# Patient Record
Sex: Female | Born: 1980 | Race: Black or African American | Hispanic: No | Marital: Single | Smoking: Current every day smoker
Health system: Southern US, Community
[De-identification: ages and names within clinical notes are randomized; demographics above are authoritative.]

## PROBLEM LIST (undated history)

## (undated) HISTORY — PX: BREAST LUMPECTOMY: SHX2

---

## 1998-12-24 ENCOUNTER — Ambulatory Visit (HOSPITAL_COMMUNITY): Admission: RE | Admit: 1998-12-24 | Discharge: 1998-12-24 | Payer: Self-pay | Admitting: *Deleted

## 1999-03-24 ENCOUNTER — Inpatient Hospital Stay (HOSPITAL_COMMUNITY): Admission: AD | Admit: 1999-03-24 | Discharge: 1999-03-27 | Payer: Self-pay | Admitting: Obstetrics

## 1999-04-07 ENCOUNTER — Observation Stay (HOSPITAL_COMMUNITY): Admission: AD | Admit: 1999-04-07 | Discharge: 1999-04-08 | Payer: Self-pay | Admitting: *Deleted

## 1999-05-03 ENCOUNTER — Inpatient Hospital Stay (HOSPITAL_COMMUNITY): Admission: AD | Admit: 1999-05-03 | Discharge: 1999-05-03 | Payer: Self-pay | Admitting: Obstetrics & Gynecology

## 1999-05-08 ENCOUNTER — Inpatient Hospital Stay (HOSPITAL_COMMUNITY): Admission: AD | Admit: 1999-05-08 | Discharge: 1999-05-08 | Payer: Self-pay | Admitting: *Deleted

## 1999-05-10 ENCOUNTER — Inpatient Hospital Stay (HOSPITAL_COMMUNITY): Admission: AD | Admit: 1999-05-10 | Discharge: 1999-05-10 | Payer: Self-pay | Admitting: Obstetrics

## 1999-05-29 ENCOUNTER — Inpatient Hospital Stay (HOSPITAL_COMMUNITY): Admission: AD | Admit: 1999-05-29 | Discharge: 1999-05-31 | Payer: Self-pay

## 1999-09-23 ENCOUNTER — Ambulatory Visit (HOSPITAL_BASED_OUTPATIENT_CLINIC_OR_DEPARTMENT_OTHER): Admission: RE | Admit: 1999-09-23 | Discharge: 1999-09-23 | Payer: Self-pay | Admitting: General Surgery

## 1999-09-23 ENCOUNTER — Encounter (INDEPENDENT_AMBULATORY_CARE_PROVIDER_SITE_OTHER): Payer: Self-pay | Admitting: *Deleted

## 2001-01-05 ENCOUNTER — Emergency Department (HOSPITAL_COMMUNITY): Admission: EM | Admit: 2001-01-05 | Discharge: 2001-01-05 | Payer: Self-pay | Admitting: Emergency Medicine

## 2002-01-17 ENCOUNTER — Encounter (HOSPITAL_BASED_OUTPATIENT_CLINIC_OR_DEPARTMENT_OTHER): Payer: Self-pay | Admitting: General Surgery

## 2002-01-19 ENCOUNTER — Encounter (INDEPENDENT_AMBULATORY_CARE_PROVIDER_SITE_OTHER): Payer: Self-pay | Admitting: *Deleted

## 2002-01-19 ENCOUNTER — Ambulatory Visit (HOSPITAL_COMMUNITY): Admission: RE | Admit: 2002-01-19 | Discharge: 2002-01-19 | Payer: Self-pay | Admitting: General Surgery

## 2002-03-04 ENCOUNTER — Emergency Department (HOSPITAL_COMMUNITY): Admission: EM | Admit: 2002-03-04 | Discharge: 2002-03-05 | Payer: Self-pay | Admitting: *Deleted

## 2003-02-22 ENCOUNTER — Encounter (HOSPITAL_BASED_OUTPATIENT_CLINIC_OR_DEPARTMENT_OTHER): Payer: Self-pay | Admitting: General Surgery

## 2003-02-26 ENCOUNTER — Encounter (INDEPENDENT_AMBULATORY_CARE_PROVIDER_SITE_OTHER): Payer: Self-pay | Admitting: *Deleted

## 2003-02-26 ENCOUNTER — Ambulatory Visit (HOSPITAL_COMMUNITY): Admission: RE | Admit: 2003-02-26 | Discharge: 2003-02-26 | Payer: Self-pay | Admitting: General Surgery

## 2003-04-08 ENCOUNTER — Emergency Department (HOSPITAL_COMMUNITY): Admission: EM | Admit: 2003-04-08 | Discharge: 2003-04-08 | Payer: Self-pay | Admitting: Emergency Medicine

## 2003-10-01 ENCOUNTER — Emergency Department (HOSPITAL_COMMUNITY): Admission: AD | Admit: 2003-10-01 | Discharge: 2003-10-01 | Payer: Self-pay | Admitting: Family Medicine

## 2003-11-22 ENCOUNTER — Other Ambulatory Visit: Admission: RE | Admit: 2003-11-22 | Discharge: 2003-11-22 | Payer: Self-pay | Admitting: Obstetrics and Gynecology

## 2004-01-29 ENCOUNTER — Inpatient Hospital Stay (HOSPITAL_COMMUNITY): Admission: AD | Admit: 2004-01-29 | Discharge: 2004-01-29 | Payer: Self-pay | Admitting: Obstetrics and Gynecology

## 2004-03-04 ENCOUNTER — Inpatient Hospital Stay (HOSPITAL_COMMUNITY): Admission: AD | Admit: 2004-03-04 | Discharge: 2004-03-04 | Payer: Self-pay | Admitting: Obstetrics and Gynecology

## 2004-03-05 ENCOUNTER — Inpatient Hospital Stay (HOSPITAL_COMMUNITY): Admission: AD | Admit: 2004-03-05 | Discharge: 2004-03-05 | Payer: Self-pay | Admitting: Obstetrics and Gynecology

## 2004-03-28 ENCOUNTER — Inpatient Hospital Stay (HOSPITAL_COMMUNITY): Admission: AD | Admit: 2004-03-28 | Discharge: 2004-03-28 | Payer: Self-pay | Admitting: Obstetrics and Gynecology

## 2004-04-24 ENCOUNTER — Inpatient Hospital Stay (HOSPITAL_COMMUNITY): Admission: AD | Admit: 2004-04-24 | Discharge: 2004-04-24 | Payer: Self-pay | Admitting: Obstetrics and Gynecology

## 2004-05-20 ENCOUNTER — Inpatient Hospital Stay (HOSPITAL_COMMUNITY): Admission: AD | Admit: 2004-05-20 | Discharge: 2004-05-22 | Payer: Self-pay | Admitting: Obstetrics and Gynecology

## 2006-10-07 ENCOUNTER — Emergency Department (HOSPITAL_COMMUNITY): Admission: EM | Admit: 2006-10-07 | Discharge: 2006-10-07 | Payer: Self-pay | Admitting: Emergency Medicine

## 2007-03-05 ENCOUNTER — Inpatient Hospital Stay (HOSPITAL_COMMUNITY): Admission: AD | Admit: 2007-03-05 | Discharge: 2007-03-05 | Payer: Self-pay | Admitting: Obstetrics and Gynecology

## 2007-03-27 ENCOUNTER — Inpatient Hospital Stay (HOSPITAL_COMMUNITY): Admission: AD | Admit: 2007-03-27 | Discharge: 2007-03-28 | Payer: Self-pay | Admitting: Obstetrics and Gynecology

## 2007-04-04 ENCOUNTER — Inpatient Hospital Stay (HOSPITAL_COMMUNITY): Admission: AD | Admit: 2007-04-04 | Discharge: 2007-04-07 | Payer: Self-pay | Admitting: Obstetrics and Gynecology

## 2007-04-05 ENCOUNTER — Encounter (INDEPENDENT_AMBULATORY_CARE_PROVIDER_SITE_OTHER): Payer: Self-pay | Admitting: Obstetrics and Gynecology

## 2008-03-24 ENCOUNTER — Emergency Department (HOSPITAL_COMMUNITY): Admission: EM | Admit: 2008-03-24 | Discharge: 2008-03-24 | Payer: Self-pay | Admitting: Emergency Medicine

## 2008-03-30 ENCOUNTER — Emergency Department (HOSPITAL_COMMUNITY): Admission: EM | Admit: 2008-03-30 | Discharge: 2008-03-30 | Payer: Self-pay | Admitting: Emergency Medicine

## 2008-07-16 ENCOUNTER — Emergency Department (HOSPITAL_COMMUNITY): Admission: EM | Admit: 2008-07-16 | Discharge: 2008-07-16 | Payer: Self-pay | Admitting: Emergency Medicine

## 2009-07-23 ENCOUNTER — Emergency Department (HOSPITAL_COMMUNITY): Admission: EM | Admit: 2009-07-23 | Discharge: 2009-07-23 | Payer: Self-pay | Admitting: Emergency Medicine

## 2010-09-19 ENCOUNTER — Emergency Department (HOSPITAL_COMMUNITY)
Admission: EM | Admit: 2010-09-19 | Discharge: 2010-09-19 | Payer: Self-pay | Source: Home / Self Care | Admitting: Emergency Medicine

## 2010-09-26 ENCOUNTER — Emergency Department (HOSPITAL_COMMUNITY)
Admission: EM | Admit: 2010-09-26 | Discharge: 2010-09-26 | Payer: Self-pay | Source: Home / Self Care | Admitting: Family Medicine

## 2010-12-22 LAB — URINALYSIS, ROUTINE W REFLEX MICROSCOPIC
Ketones, ur: NEGATIVE mg/dL
Leukocytes, UA: NEGATIVE
Nitrite: NEGATIVE
Specific Gravity, Urine: 1.016 (ref 1.005–1.030)
Urobilinogen, UA: 0.2 mg/dL (ref 0.0–1.0)
pH: 6 (ref 5.0–8.0)

## 2010-12-22 LAB — URINE MICROSCOPIC-ADD ON

## 2010-12-22 LAB — POCT PREGNANCY, URINE: Preg Test, Ur: NEGATIVE

## 2010-12-22 LAB — WET PREP, GENITAL
Clue Cells Wet Prep HPF POC: NONE SEEN
Yeast Wet Prep HPF POC: NONE SEEN

## 2011-01-14 LAB — BASIC METABOLIC PANEL
CO2: 27 mEq/L (ref 19–32)
Calcium: 8.6 mg/dL (ref 8.4–10.5)
Chloride: 107 mEq/L (ref 96–112)
GFR calc Af Amer: >60 mL/min (ref >60)
Glucose, Bld: 82 mg/dL (ref 70–99)
Potassium: 4 mEq/L (ref 3.5–5.1)
Sodium: 138 mEq/L (ref 135–145)

## 2011-01-14 LAB — CBC
HCT: 35.4 % — ABNORMAL LOW (ref 36.0–46.0)
Hemoglobin: 12 g/dL (ref 12.0–15.0)
MCHC: 34 g/dL (ref 30.0–36.0)
RBC: 4.12 MIL/uL (ref 3.87–5.11)
RDW: 13.4 % (ref 11.5–15.5)

## 2011-01-14 LAB — URINE CULTURE: Colony Count: >=100000

## 2011-01-14 LAB — GC/CHLAMYDIA PROBE AMP, GENITAL: GC Probe Amp, Genital: NEGATIVE

## 2011-01-14 LAB — URINALYSIS, ROUTINE W REFLEX MICROSCOPIC
Bilirubin Urine: NEGATIVE
Hgb urine dipstick: NEGATIVE
Ketones, ur: NEGATIVE mg/dL
Nitrite: POSITIVE — AB
Protein, ur: NEGATIVE mg/dL
Specific Gravity, Urine: 1.023 (ref 1.005–1.030)
Urobilinogen, UA: 0.2 mg/dL (ref 0.0–1.0)

## 2011-01-14 LAB — DIFFERENTIAL
Basophils Absolute: 0 10*3/uL (ref 0.0–0.1)
Basophils Relative: 1 % (ref 0–1)
Eosinophils Relative: 2 % (ref 0–5)
Monocytes Absolute: 0.5 10*3/uL (ref 0.1–1.0)
Monocytes Relative: 9 % (ref 3–12)
Neutro Abs: 4.2 10*3/uL (ref 1.7–7.7)

## 2011-01-14 LAB — URINE MICROSCOPIC-ADD ON

## 2011-02-23 NOTE — H&P (Signed)
NAMEKARMA, ANSLEY NO.:  [id number]   MEDICAL RECORD NO.:  [id number]          PATIENT TYPE:  INP   LOCATION:  9112                          FACILITY:  [facility]   PHYSICIAN:  Malachi Pro. Ambrose Mantle, M.D. DATE OF BIRTH:  1981-01-18   DATE OF ADMISSION:  04/04/2007  DATE OF DISCHARGE:                              HISTORY & PHYSICAL   A 30 year old, black, single female, para 2-0-1-2, gravida 4, [facility] May 01, 2007, admitted with persistent contractions and progressive cervical  dilatation to 5 to 6 cm.  Blood group and type O positive, negative  antibody, sickle cell negative, RPR nonreactive, rubella immune,  hepatitis B surface antigen negative, HIV negative, GC and chlamydia  negative.  One-hour Glucola 92.  Group B strep status unknown.  [facility]  was called; the culture was in progress.  Quad screen was negative.  The  patient had an ultrasound on November 30, 2006; average gestational age  was 18 weeks 0 days, [facility] May 01, 2007.  Ultrasound on December 26, 2006:  [facility] May 03, 2007.  Prenatal care was uncomplicated except for  complaints of contractions.  Fetal fibronectin was negative on March 16, 2007.  On April 03, 2007, the cervix was 4 cm dilated.  However, on April 04, 2007, I examined the patient and felt she was 2-3 cm dilated and the  cervix was somewhat posterior.  She came to maternity admissions  contracting every 7 minutes and was initially thought by the MAU nurse  to be 6 cm.  However, subsequent exams by the charge nurse showed the  cervix to initially be 3-4 cm and progressed up to 4-5 cm.  She was  admitted and reached 5-6 cm in spite of fact her contractions were only  every 4-10 minutes.   PAST MEDICAL HISTORY:  SHOWED ALLERGY TO SHELLFISH.  Illnesses:  Anxiety.  Operations:  1993 appendectomy 1998, 2000 and 2004 breast  lumpectomies.   Alcohol, tobacco and drugs none.   FAMILY HISTORY:  Father with high blood pressure.   PHYSICAL EXAMINATION:  On  admission revealed normal vital signs.  HEART/LUNGS:  Were normal.  The abdomen was soft.  The fundal height had  been 36 cm on April 03, 2007.  Fetal heart tones showed no decelerations.  At 3:20 a.m. on April 05, 2007, the cervix was 5-6 cm, 80%, vertex at a -  3.  Artificial rupture of the membranes produced clear fluid.   IMPRESSION:  Intrauterine pregnancy at 36 weeks and 2 days.  The patient  was placed on IV penicillin for her unknown group B strep status and  less than 37 weeks' gestation.  She was placed on Pitocin.  She also  requested and received an epidural.  She progressed to full dilatation  and delivered spontaneously OA over an intact perineum by Dr. Ambrose Mantle a  living female infant, 5 pounds 13 ounces, with Apgars of 8 at one and 9  at five minutes.  Placenta was intact.  The uterus was normal.  There  were no lacerations.  Blood loss about 400  mL.  The patient requests  tubal  sterilization.  She has signed her sterilization forms over 30 days.  She has been counseled about the disadvantages proceeding to tubal  ligation.  She insists on proceeding with the procedure.  It is  scheduled for 2:30 p.m. on April 05, 2007.      Malachi Pro. Ambrose Mantle, M.D.  Electronically Signed     TFH/MEDQ  D:  04/05/2007  T:  04/05/2007  Job:  [id number]

## 2011-02-23 NOTE — Discharge Summary (Signed)
Raven Liu, BENEGAS NO.:  [id number]   MEDICAL RECORD NO.:  [id number]          PATIENT TYPE:  INP   LOCATION:  9112                          [facility]  [facility]   PHYSICIAN:  Malachi Pro. Ambrose Mantle, M.D. DATE OF BIRTH:  Jun 15, 1981   DATE OF ADMISSION:  04/04/2007  DATE OF DISCHARGE:  04/07/2007                               DISCHARGE SUMMARY   A 30 year old, black, single female, para 2-0-1-2, gravida 4, [facility] May 01, 2007 admitted with persistent contractions and progressive cervical  dilatation to 5-6 cm, O+, negative antibody, sickle cell negative, RPR  nonreactive, rubella immune, hepatitis B surface antigen negative, HIV  negative, GC and chlamydia negative.  One-hour Glucola 92.  Group B  strep status was unknown.  Quad screen was negative.  The patient's  history and physical are dictated in the history and physical, and in  essence, she came into the hospital with persistent contractions, made  cervical change to 5-6 cm.  Because of her unknown group B strep status  and less than [redacted] weeks gestation, she was placed on penicillin and  Pitocin, progressed to full dilatation and pushed out a living female  infant, 5 pounds 13 ounces, with Apgars of 8 at one and 9 at five  minutes.  The patient requested tubal ligation.  I emphasized to her the  uncertainty of the baby's health situation been premature, that I would  prefer that she wait for the tubal, but she insisted, so we went ahead  and did a tubal ligation in the afternoon of April 05, 2007.  She was  done well postoperatively, and on the second postpartum day, she is  ready for discharge.  RPR was nonreactive.  Initial hemoglobin 10.6,  hematocrit 32.2, white count 9900, platelet count 274,000.  Follow-up  hemoglobin was 9.4.  Urinalysis was negative.   FINAL DIAGNOSIS:  Intrauterine pregnancy at 36+ weeks, delivered vertex.  Voluntary sterilization.   OPERATION:  Spontaneous delivery, vertex.  There were no  lacerations.  Bilateral tubal ligation.   FINAL CONDITION:  Improved.   INSTRUCTIONS:  Include our regular discharge instruction booklet.  Percocet 5/325 #30 tablets 1 every 4-6 hours as needed for pain and no  refills, and the patient is asked to return to the office in 10 days for  follow-up examination.      Malachi Pro. Ambrose Mantle, M.D.  Electronically Signed     TFH/MEDQ  D:  04/07/2007  T:  04/07/2007  Job:  [id number]

## 2011-02-23 NOTE — Op Note (Signed)
Raven Liu, Raven Liu NO.:  [id number]   MEDICAL RECORD NO.:  [id number]          PATIENT TYPE:  INP   LOCATION:  9112                          [facility]   PHYSICIAN:  Malachi Pro. Ambrose Mantle, M.D. DATE OF BIRTH:  May 29, 1981   DATE OF PROCEDURE:  04/05/2007  DATE OF DISCHARGE:                               OPERATIVE REPORT   PREOPERATIVE DIAGNOSIS:  Voluntary sterilization.   POSTOPERATIVE DIAGNOSIS:  Voluntary sterilization.   OPERATION:  Bilateral tubal ligation.   OPERATOR:  Dr. Ambrose Mantle   ANESTHESIA:  Epidural anesthesia.   DESCRIPTION OF PROCEDURE:  The patient was brought to the operating  room, the epidural anesthetic was boosted, took approximately 20 minutes  for it to become effective. After anesthesia was confirmed a semilunar  incision was made in the inferior portion of the umbilicus. I made it in  the inferior portion of the umbilicus even though the patient's linea  nigra actually went to the left of the midline.  I carried in layers  down to the fascia and the fascia was actually opened with a hemostat.  The peritoneal cavity was entered with the help of one 4 x 18 sponge.  The right tube was identified and traced to its fimbriated end.  I did  not see the right ovary.  I made an incision in the mesosalpinx on the  right that two ties proximally and distally on the tube and then cut out  the intervening segment.  There was no bleeding. The exact same  procedure was performed on the left side. What could be seen of the  uterus appeared normal. The left ovary also could not be seen.  Both  tubal fimbria looked completely normal.  I then closed the peritoneum  with a pursestring suture of 0 Vicryl.  The fascia with 3-4 interrupted  figure-of-eight sutures of 0 Vicryl, subcu with 3-0 Vicryl and the skin  was reapproximated with 3-0 plain catgut.  The patient seemed to  tolerate the procedure well.  Blood loss was less than 10 mL.  Sponge  and needle  counts were correct and she was returned to recovery in  satisfactory condition.      Malachi Pro. Ambrose Mantle, M.D.  Electronically Signed     TFH/MEDQ  D:  04/05/2007  T:  04/05/2007  Job:  [id number]

## 2011-02-26 NOTE — Discharge Summary (Signed)
NAME:  DARCEL, ZICK                         ACCOUNT NO.:  [id number]   MEDICAL RECORD NO.:  [id number]                   PATIENT TYPE:  INP   LOCATION:  9147                                 [facility]   PHYSICIAN:  Malachi Pro. Ambrose Mantle, M.D.              DATE OF BIRTH:  Nov 06, 1980   DATE OF ADMISSION:  05/20/2004  DATE OF DISCHARGE:                                 DISCHARGE SUMMARY   HOSPITAL COURSE:  A 30 year old black female para 1-0-1-1;  [facility] June 04, 2004 by ultrasound; admitted with contractions and the cervix 4 cm dilated.  Blood group and type O positive, negative antibody, sickle cell negative,  RPR nonreactive, rubella immune, hepatitis B surface antigen negative, HIV  negative, GC and chlamydia negative, triple screen normal, 1-hour Glucola  89, group B strep negative.  Vaginal ultrasound on November 05, 2003:  Crown-  rump length 2.9 cm, 9 weeks 5 days, [facility] June 04, 2005.  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  Repeat ultrasound on January 07, 2004:  Average gestational age [redacted] weeks 0 days; [facility] June 02, 2004; cervix  was 3.1 cm long.  Repeat ultrasound on February 06, 2004:  The cervix was 3.19  cm.  On Mar 04, 2004 the cervix was 2.06 cm.  The patient was given  Delalutin.  On April 16, 2004 cervical length was 3.1 cm.  On April 26, 2004  the cervix was 2+ cm dilated.  On May 06, 2004 the cervix was 3 cm.  On  May 13, 2004 the cervix was 3-4 cm.  On the day of admission the patient  presented to the office with painful contractions.  The cervix was 4 cm.  She was sent here for admission for augmentation.  Past medical history  revealed no known allergies.  Operations:  In 1998, appendectomy.  In 1998,  2000, and 2004 she had breast lumpectomies.  Illnesses:  Anxiety.  She did  have gonorrhea in 2003 that was treated.  Alcohol, tobacco, and drugs:  None.  Family history:  Father with high blood pressure.  Obstetric history:  In August 2000 she  delivered a 7-pound 7-ounce female vaginally.  Physical  exam on admission revealed normal vital signs.  Heart and lungs were normal.  The abdomen was soft.  Fundal height had been 38 cm at her last office visit  on the day of admission.  Fetal heart tones were normal.  The cervix was 4  cm, 70%, vertex at a -2.  Artificial rupture of the membranes produced clear  fluid.  The patient was placed on IV Pitocin.  She reached 6 cm and received  an epidural.  By 8:10 p.m. the Pitocin was at 4 mU/minute; the cervix was 8  cm.  She became fully dilated, pushed well, and delivered spontaneously OA  over an intact perineum by Dr. Ambrose Mantle, a living female infant,  7 pounds 7  ounces, Apgars of 9 at one and 9 at five minutes.  Placenta was intact,  uterus normal.  A right periurethral laceration was hemostatic and not  repaired.  Blood loss about 400 mL.  The patient elected not to have a  tubal.  Just prior to delivery the patient was noted to have breaks in her  skin on the perineal area and she attributed those to razor cuts, but a  herpes culture was obtained.  Postpartum the patient did quite well and was  discharged on postpartum day #2.  The herpes culture is pending.  Hemoglobin  on admission 10.8; hematocrit 33.5; white count 10,100; platelet count  321,000.  Follow-up hemoglobin 9.5.  RPR was nonreactive.   FINAL DIAGNOSIS:  Intrauterine pregnancy at 38 weeks delivered occiput  anterior.   OPERATION:  Spontaneous delivery OA.   FINAL CONDITION:  Improved.   Instructions include our regular discharge instruction booklet.  The patient  declines analgesics at discharge.  Follow-up on the herpes culture will be  done.  The patient is asked to return to our office in 6 weeks for follow-up  examination.                                              Malachi Pro. Ambrose Mantle, M.D.   TFH/MEDQ  D:  05/22/2004  T:  05/22/2004  Job:  [id number]

## 2011-02-26 NOTE — Op Note (Signed)
NAME:  Raven Liu, Raven Liu                         ACCOUNT NO.:  [id number]   MEDICAL RECORD NO.:  [id number]                   PATIENT TYPE:  OIB   LOCATION:  2899                                 FACILITY:  [facility]   PHYSICIAN:  Leonie Man, M.D.                DATE OF BIRTH:  Sep 01, 1981   DATE OF PROCEDURE:  02/26/2003  DATE OF DISCHARGE:                                 OPERATIVE REPORT   PREOPERATIVE DIAGNOSIS:  Fibroadenomas of breast, right and left.   POSTOPERATIVE DIAGNOSIS:  Fibroadenomas of breast, right and left.   OPERATION PERFORMED:  1. Excision of fibroadenoma, left breast.  2. Excision of fibroadenoma, right breast.   SURGEON:  Leonie Man, M.D.   ASSISTANT:  Nurse.   ANESTHESIA:  General.   INDICATIONS FOR PROCEDURE:  The patient is a 30 year old woman with a strong  family history of breast cancer who has had multiple fibroadenomas in the  past __________ each lump removed.  She presents now with bilateral  fibroadenomas.  She was brought to the operating room after the risks and  benefits of surgery had been discussed, all questions answered and consent  obtained.   DESCRIPTION OF PROCEDURE:  Following induction of satisfactory general  anesthesia, the patient was positioned supinely.  The breasts were prepped  and draped to be included in a sterile operative field. Going to the left  breast first, at the 12 o'clock position, there is a fibroadenoma subjacent  to her previous scar.  I encircled the scar cicatrix with an elliptical  incision, deepened this through the skin and subcutaneous tissue to the  fibroadenoma.  The fibroadenoma was grasped and dissected free in its  entirety.  Hemostasis was obtained with electrocautery.  Sponge and  instrument counts for the left side were verified.  The wound was closed  with 2-0 Vicryl sutures in the breast tissues and 3-0 Vicryl sutures in the  subcutaneous tissue, 5-0 Monocryl suture in the skin.  The wound  was  reinforced with Steri-Strips.   Attention was then turned to the right side where laterally at the 10  o'clock axis, there is another fibroadenoma subjacent to her previous scar.  Again, this scar was excised with elliptical incision and dissection carried  down to the fibroadenoma.  The mass was grasped and dissected free in its  entirety and removed and forwarded for pathological evaluation.  Hemostasis  was obtained in this wound with electrocautery.  Sponge and instrument  counts were verified.  The wound was closed in layers with 0 Vicryl suture  in the deep tissues, reapproximating the breast  tissue and with 3-0 Vicryl in the subcutaneous tissues and 5-0 Monocryl in  the skin.  This wound was also reinforced with Steri-Strips.  Sterile  dressings were applied to both wounds.  Anesthetic reversed and the patient  removed from the operating room to the recovery room in stable condition.  The patient tolerated the procedure well.                                                Leonie Man, M.D.    PB/MEDQ  D:  02/26/2003  T:  02/26/2003  Job:  [id number]

## 2011-02-26 NOTE — Op Note (Signed)
[place]  Patient:    Raven Liu, Raven Liu Visit Number: [id number] MRN: [id number]          Service Type: DSU Location: [facility] 2899 27 Attending Physician:  Sonda Primes Dictated by:   Mardene Celeste Lurene Shadow, M.D. Proc. Date: 01/19/02 Admit Date:  01/19/2002   CC:         Luisa Hart L. Lurene Shadow, M.D. 2 copies   Operative Report  PREOPERATIVE DIAGNOSIS:  Bilateral fibroadenoma.  POSTOPERATIVE DIAGNOSIS:  Bilateral fibroadenoma.  PROCEDURE:  Excision of fibroadenoma of the left breast, one nodule.  Excision of fibroadenoma of right breast, three nodules.  SURGEON:  Mardene Celeste. Lurene Shadow, M.D.  ASSISTANT:  Nurse.  ANESTHESIA:  General.  INDICATIONS FOR PROCEDURE:  This patient is a 30 year old female with multiple fibroadenomas in the past.  She presents now with breast masses which have been increasing in size and desires excision.  The risks and benefits of surgery have been discussed in full with her, and all questions answered.  She gives consent to surgery.  DESCRIPTION OF PROCEDURE:  Following the induction of satisfactory general anesthesia, the patient positioned supinely, the breasts were prepped and draped to be included in the sterile operative field.  The lesion in the right breast was located at the 5 oclock position approximately 3 fingerbreadths from the areolar border.  This was approached through a circumareolar incision, carried down in the lower inner quadrant of the areola.  The skin and subcutaneous tissues arranging patent flap inferiorly and medially towards the mass.  The mass was then located and grasped, dissected free from the surrounding tissues, and removed in its entirety and forwarded for pathologic evaluation.  Hemostasis was obtained with electrocautery.  The breast tissue was reapproximated with 3-0 Vicryl.  Subcutaneous tissue was closed with 3-0 Vicryl, and the skin was closed with a running 5-0 Monocryl  suture.  Attention was then turned to the left breast, where a similar circumareolar incision was made, except this time it was carried out on the upper outer quadrant of the areolar border.  This was deepened through the skin and subcutaneous tissues to a cluster of fibroadenomas which were located in the 2 oclock axis of the breast.  The masses were serially located, and dissected free from the surrounding tissues.  They appeared grossly to be fibroadenoma and were forwarded for pathologic evaluation.  Hemostasis was then obtained in the wound with electrocautery.  Breast tissue was reapproximated with 3-0 Vicryl, subcutaneous tissue was closed with 3-0 Vicryl, and the skin closed with a running 5-0 Monocryl.  Both wounds were then reinforced with Steri-Strips, and sterile compressive dressings applied.  Anesthetic reversed.  The patient was removed from the operating room to the recovery room, having tolerated the procedure well, and all sponge and instrument counts were verified. Dictated by:   Mardene Celeste. Lurene Shadow, M.D. Attending Physician:  Sonda Primes DD:  01/19/02 TD:  01/20/02 Job: [id number]

## 2011-02-26 NOTE — Op Note (Signed)
[place]  Patient:    IZABELA OW                       MRN: [id number] Proc. Date: 09/23/99 Adm. Date:  [id number] Attending:  Fortino Sic                           Operative Report  PREOPERATIVE DIAGNOSIS:  Two masses, left breast.  POSTOPERATIVE DIAGNOSIS:  Two masses, left breast.  OPERATION PERFORMED:  Excision of left breast masses.  SURGEON:  Marnee Spring. Wiliam Ke, M.D.  ASSISTANT:  None.  ANESTHESIA:  General by hospital.  DESCRIPTION OF PROCEDURE:  Under good general anesthesia, skin of the breast was prepped and draped in the usual manner.  There were two masses; one was approximately 2 cm and was in the 12 oclock position; the other was approximately 1 cm and it was in the 4 oclock position.  The incision was made over the masses and each one were dissected free from surrounding soft tissue.  Hemostasis was obtained with electrocautery current.  Defect was closed with subcutaneous 3-0 Vicryl and subcuticular 4-0 Dexon.  Steri-Strips were applied to both wounds. Estimated blood loss:  Minimal.  Patient received no blood and left the operating room in satisfactory condition after the sponge and needle counts were verified. DD:  09/23/99 TD:  09/24/99 Job: [id number]

## 2011-03-11 ENCOUNTER — Emergency Department (HOSPITAL_COMMUNITY)
Admission: EM | Admit: 2011-03-11 | Discharge: 2011-03-11 | Disposition: A | Discharge status: Home / Self Care | Payer: Medicaid Other | Attending: Emergency Medicine | Admitting: Emergency Medicine

## 2011-03-11 DIAGNOSIS — M546 Pain in thoracic spine: Secondary | ICD-10-CM | POA: Insufficient documentation

## 2011-03-11 DIAGNOSIS — M545 Low back pain, unspecified: Secondary | ICD-10-CM | POA: Insufficient documentation

## 2011-03-11 DIAGNOSIS — M542 Cervicalgia: Secondary | ICD-10-CM | POA: Insufficient documentation

## 2011-03-11 DIAGNOSIS — Z8541 Personal history of malignant neoplasm of cervix uteri: Secondary | ICD-10-CM | POA: Insufficient documentation

## 2011-07-08 LAB — URINALYSIS, ROUTINE W REFLEX MICROSCOPIC
Bilirubin Urine: NEGATIVE
Leukocytes, UA: NEGATIVE
Nitrite: NEGATIVE
Specific Gravity, Urine: 1.024
Urobilinogen, UA: 0.2

## 2011-07-08 LAB — COMPREHENSIVE METABOLIC PANEL
BUN: 8
CO2: 25
Calcium: 8.8
Chloride: 105
Creatinine, Ser: 0.86
GFR calc non Af Amer: >60
Total Bilirubin: 1.1

## 2011-07-08 LAB — DIFFERENTIAL
Basophils Absolute: 0
Lymphocytes Relative: 18
Lymphs Abs: 1.5
Neutro Abs: 5.7
Neutrophils Relative %: 68

## 2011-07-08 LAB — URINE MICROSCOPIC-ADD ON

## 2011-07-08 LAB — OVA AND PARASITE EXAMINATION: Ova and parasites: NONE SEEN

## 2011-07-08 LAB — CBC
HCT: 40.7
MCHC: 33.6
MCV: 83.9
Platelets: 323
RBC: 4.86
WBC: 8.4

## 2011-07-08 LAB — STOOL CULTURE

## 2011-07-08 LAB — PREGNANCY, URINE: Preg Test, Ur: NEGATIVE

## 2011-07-08 LAB — WET PREP, GENITAL
Clue Cells Wet Prep HPF POC: NONE SEEN
Trich, Wet Prep: NONE SEEN
Yeast Wet Prep HPF POC: NONE SEEN

## 2011-07-08 LAB — RAPID STREP SCREEN (MED CTR MEBANE ONLY): Streptococcus, Group A Screen (Direct): POSITIVE — AB

## 2011-07-08 LAB — FECAL LACTOFERRIN, QUANT: Fecal Lactoferrin: NEGATIVE

## 2011-07-08 LAB — LIPASE, BLOOD: Lipase: 17

## 2011-07-08 LAB — GC/CHLAMYDIA PROBE AMP, GENITAL
Chlamydia, DNA Probe: NEGATIVE
GC Probe Amp, Genital: NEGATIVE

## 2011-07-28 LAB — CBC
Hemoglobin: 10.6 — ABNORMAL LOW
MCHC: 33
MCV: 83.3
Platelets: 217
Platelets: 274
RBC: 3.43 — ABNORMAL LOW
RDW: 14.3 — ABNORMAL HIGH
WBC: 8.8

## 2011-07-28 LAB — URINALYSIS, ROUTINE W REFLEX MICROSCOPIC
Glucose, UA: NEGATIVE
Ketones, ur: 15 — AB
Nitrite: NEGATIVE
Nitrite: NEGATIVE
Protein, ur: NEGATIVE
Urobilinogen, UA: 1
pH: 6
pH: 6

## 2011-07-28 LAB — RPR: RPR Ser Ql: NONREACTIVE

## 2011-12-27 ENCOUNTER — Encounter (HOSPITAL_COMMUNITY): Payer: Self-pay | Admitting: Emergency Medicine

## 2011-12-27 ENCOUNTER — Emergency Department (HOSPITAL_COMMUNITY)
Admission: EM | Admit: 2011-12-27 | Discharge: 2011-12-27 | Disposition: A | Discharge status: Home / Self Care | Payer: Self-pay | Attending: Emergency Medicine | Admitting: Emergency Medicine

## 2011-12-27 DIAGNOSIS — R531 Weakness: Secondary | ICD-10-CM

## 2011-12-27 DIAGNOSIS — Z0389 Encounter for observation for other suspected diseases and conditions ruled out: Secondary | ICD-10-CM | POA: Insufficient documentation

## 2011-12-27 DIAGNOSIS — R110 Nausea: Secondary | ICD-10-CM

## 2011-12-27 NOTE — ED Notes (Signed)
Called pt to go back to room 8, no answer. Will cb in a few minutes

## 2011-12-27 NOTE — ED Notes (Signed)
Pt to ED for eval nausea, weakness, diarrhea, abd cramping started yesterday; LMP currently on menses reports that it is light;

## 2011-12-27 NOTE — ED Notes (Signed)
Called pt again, no answer.

## 2011-12-28 NOTE — ED Provider Notes (Signed)
The patient left the ER without being seen by a provider. I did not have the chance to obtain a hx and perform a physical exam on this pt. She left without informing staff   Lyanne Co, MD 12/28/11 1040

## 2012-01-19 ENCOUNTER — Emergency Department (HOSPITAL_COMMUNITY)
Admission: EM | Admit: 2012-01-19 | Discharge: 2012-01-19 | Disposition: A | Discharge status: Home / Self Care | Payer: Self-pay | Attending: Emergency Medicine | Admitting: Emergency Medicine

## 2012-01-19 ENCOUNTER — Encounter (HOSPITAL_COMMUNITY): Payer: Self-pay | Admitting: *Deleted

## 2012-01-19 DIAGNOSIS — N946 Dysmenorrhea, unspecified: Secondary | ICD-10-CM | POA: Insufficient documentation

## 2012-01-19 LAB — URINALYSIS, ROUTINE W REFLEX MICROSCOPIC
Glucose, UA: NEGATIVE mg/dL
Leukocytes, UA: NEGATIVE
Nitrite: NEGATIVE
Specific Gravity, Urine: 1.029 (ref 1.005–1.030)
pH: 6 (ref 5.0–8.0)

## 2012-01-19 LAB — POCT PREGNANCY, URINE: Preg Test, Ur: NEGATIVE

## 2012-01-19 MED ORDER — IBUPROFEN 800 MG PO TABS
800.0000 mg | ORAL_TABLET | Freq: Once | ORAL | Status: AC
  Administered 2012-01-19: 800 mg via ORAL
  Filled 2012-01-19: qty 1

## 2012-01-19 MED ORDER — IBUPROFEN 800 MG PO TABS: 800.0000 mg | ORAL_TABLET | Freq: Three times a day (TID) | ORAL | Status: AC

## 2012-01-19 MED ORDER — MORPHINE SULFATE 4 MG/ML IJ SOLN
4.0000 mg | Freq: Once | INTRAMUSCULAR | Status: AC
  Administered 2012-01-19: 4 mg via INTRAVENOUS
  Filled 2012-01-19: qty 1

## 2012-01-19 NOTE — ED Provider Notes (Signed)
History     CSN: [id number]  Arrival date & time 01/19/12  7253   First MD Initiated Contact with Patient 01/19/12 [phone]      Chief Complaint  Patient presents with  . Abdominal Pain    (Consider location/radiation/quality/duration/timing/severity/associated sxs/prior treatment) HPI Patient is a 31 year old G3 P3 70 female who presents today complaining of severe right lower cautery and abdominal pain that she rates as a 10 out of 10 as well as vaginal bleeding. Patient's last period was 3 weeks ago. Patient says today is her first day of her period and the sensation she has a cramping sensation but it has never been this severe before. She took ibuprofen at home with no relief. Patient is tearful.  She had her appendix removed at the age of 15. Patient also has clips in place for tubal ligation. These were placed 5 years ago. Patient denies any vaginal discharge prior to this beginning. She has no history of ovarian cysts. She does not suspect that she could have infection transmitted disease. She does endorse some diarrhea but denies nausea, vomiting, fevers, or urinary symptoms.There are no other associated or modifying factors.  History reviewed. No pertinent past medical history.  Past Surgical History  Procedure Date  . Breast lumpectomy     bil breast: approx 2006    History reviewed. No pertinent family history.  History  Substance Use Topics  . Smoking status: Current Everyday Smoker -- 0.5 packs/day  . Smokeless tobacco: Not on file  . Alcohol Use: Yes     occasionally    OB History    Grav Para Term Preterm Abortions TAB SAB Ect Mult Living                  Review of Systems  Constitutional: Negative.   HENT: Negative.   Eyes: Negative.   Respiratory: Negative.   Cardiovascular: Negative.   Gastrointestinal: Positive for nausea, abdominal pain and diarrhea.  Genitourinary: Positive for vaginal bleeding and menstrual problem.  Musculoskeletal: Negative.     Skin: Negative.   Neurological: Negative.   Hematological: Negative.   Psychiatric/Behavioral: Negative.   All other systems reviewed and are negative.    Allergies  Shellfish allergy  Home Medications  No current outpatient prescriptions on file.  BP 119/73  Pulse 80  Temp 98.2 F (36.8 C)  Resp 15  SpO2 100%  LMP 01/19/2012  Physical Exam  Nursing note and vitals reviewed. GEN: Well-developed, well-nourished female in no distress, tearful and very uncomfortable HEENT: Atraumatic, normocephalic. Oropharynx clear without erythema EYES: PERRLA BL, no scleral icterus. NECK: Trachea midline, no meningismus CV: regular rate and rhythm. No murmurs, rubs, or gallops PULM: No respiratory distress.  No crackles, wheezes, or rales. GI: soft, TTP over the RLQ. No guarding, rebound, + bowel sounds  GU: pelvic with vaginal bleeding, normal cervix, no CMT, no adnexal tenderness, cervix visually closed Neuro: cranial nerves 2-12 intact, no abnormalities of strength or sensation, A and O x 3 MSK: Patient moves all 4 extremities symmetrically, no deformity, edema, or injury noted Skin: No rashes petechiae, purpura, or jaundice Psych tearful   ED Course  Procedures (including critical care time)  Labs Reviewed  URINALYSIS, ROUTINE W REFLEX MICROSCOPIC - Abnormal; Notable for the following:    APPearance HAZY (*)    Bilirubin Urine SMALL (*)    All other components within normal limits  POCT PREGNANCY, URINE  WET PREP, GENITAL   No results found.   1. Menstrual  cramps       MDM  Patient had improvement after being treated initially with a dose of pain medication.  She had unremarkable urinalysis and negative pregnancy test.  Pelvic was unremarkable and though wet prep and GC were ordered initially these were canceled as they were not felt necessary based on patient's exam.  The patient was comfortable with this and was discharged with prescription for ibuprofen and work note  in good condition.        Cyndra Numbers, MD 01/19/12 2152

## 2012-01-19 NOTE — ED Notes (Signed)
Reports onset this am of menstrual cycle, having severe lower abd pain that radiates down right leg and heavy bleeding.

## 2012-01-19 NOTE — Discharge Instructions (Signed)
Dysmenorrhea Menstrual pain is caused by the muscles of the uterus tightening (contracting) during a menstrual period. The muscles of the uterus contract due to the chemicals in the uterine lining. Primary dysmenorrhea is menstrual cramps that last a couple of days when you start having menstrual periods or soon after. This often begins after a teenager starts having her period. As a woman gets older or has a baby, the cramps will usually lesson or disappear. Secondary dysmenorrhea begins later in life, lasts longer, and the pain may be stronger than primary dysmenorrhea. The pain may start before the period and last a few days after the period. This type of dysmenorrhea is usually caused by an underlying problem such as:  The tissue lining the uterus grows outside of the uterus in other areas of the body (endometriosis).   The endometrial tissue, which normally lines the uterus, is found in or grows into the muscular walls of the uterus (adenomyosis).   The pelvic blood vessels are engorged with blood just before the menstrual period (pelvic congestive syndrome).   Overgrowth of cells in the lining of the uterus or cervix (polyps of the uterus or cervix).   Falling down of the uterus (prolapse) because of loose or stretched ligaments.   Depression.   Bladder problems, infection, or inflammation.   Problems with the intestine, a tumor, or irritable bowel syndrome.   Cancer of the female organs or bladder.   A severely tipped uterus.   A very tight opening or closed cervix.   Noncancerous tumors of the uterus (fibroids).   Pelvic inflammatory disease (PID).   Pelvic scarring (adhesions) from a previous surgery.   Ovarian cyst.   An intrauterine device (IUD) used for birth control.  CAUSES  The cause of menstrual pain is often unknown. SYMPTOMS   Cramping or throbbing pain in your lower abdomen.   Sometimes, a woman may also experience headaches.   Lower back pain.    Feeling sick to your stomach (nausea) or vomiting.   Diarrhea.   Sweating or dizziness.  DIAGNOSIS  A diagnosis is based on your history, symptoms, physical examination, diagnostic tests, or procedures. Diagnostic tests or procedures may include:  Blood tests.   An ultrasound.   An examination of the lining of the uterus (dilation and curettage, D&C).   An examination inside your abdomen or pelvis with a scope (laparoscopy).   X-rays.   CT Scan.   MRI.   An examination inside the bladder with a scope (cystoscopy).   An examination inside the intestine or stomach with a scope (colonoscopy, gastroscopy).  TREATMENT  Treatment depends on the cause of the dysmenorrhea. Treatment may include:  Pain medicine prescribed by your caregiver.   Birth control pills.   Hormone replacement therapy.   Nonsteroidal anti-inflammatory drugs (NSAIDs). These may help stop the production of prostaglandins.   An IUD with progesterone hormone in it.   Acupuncture.   Surgery to remove adhesions, endometriosis, ovarian cyst, or fibroids.   Removal of the uterus (hysterectomy).   Progesterone shots to stop the menstrual period.   Cutting the nerves on the sacrum that go to the female organs (presacral neurectomy).   Electric currant to the sacral nerves (sacral nerve stimulation).   Antidepressant medicine.   Psychiatric therapy, counseling, or group therapy.   Exercise and physical therapy.   Meditation and yoga therapy.  HOME CARE INSTRUCTIONS   Only take over-the-counter or prescription medicines for pain, discomfort, or fever as directed by your   caregiver.   Place a heating pad or hot water bottle on your lower back or abdomen. Do not sleep with the heating pad.   Use aerobic exercises, walking, swimming, biking, and other exercises to help lessen the cramping.   Massage to the lower back or abdomen may help.   Stop smoking.   Avoid alcohol and caffeine.   Yoga,  meditation, or acupuncture may help.  SEEK MEDICAL CARE IF:   The pain does not get better with medicine.   You have pain with sexual intercourse.  SEEK IMMEDIATE MEDICAL CARE IF:   Your pain increases and is not controlled with medicines.   You have a fever.   You develop nausea or vomiting with your period not controlled with medicine.   You have abnormal vaginal bleeding with your period.   You pass out.  MAKE SURE YOU:   Understand these instructions.   Will watch your condition.   Will get help right away if you are not doing well or get worse.  Document Released: 09/27/2005 Document Revised: 09/16/2011 Document Reviewed: 01/13/2009 [employer] Patient Information 2012 [employer]. 

## 2012-01-19 NOTE — ED Notes (Signed)
Pt claimed that her abdominal pain is better

## 2012-09-25 ENCOUNTER — Emergency Department (HOSPITAL_COMMUNITY)
Admission: EM | Admit: 2012-09-25 | Discharge: 2012-09-25 | Disposition: A | Discharge status: Home / Self Care | Payer: Self-pay | Attending: Emergency Medicine | Admitting: Emergency Medicine

## 2012-09-25 ENCOUNTER — Emergency Department (HOSPITAL_COMMUNITY): Payer: Self-pay

## 2012-09-25 ENCOUNTER — Encounter (HOSPITAL_COMMUNITY): Payer: Self-pay

## 2012-09-25 DIAGNOSIS — Y929 Unspecified place or not applicable: Secondary | ICD-10-CM | POA: Insufficient documentation

## 2012-09-25 DIAGNOSIS — R05 Cough: Secondary | ICD-10-CM | POA: Insufficient documentation

## 2012-09-25 DIAGNOSIS — Z2089 Contact with and (suspected) exposure to other communicable diseases: Secondary | ICD-10-CM | POA: Insufficient documentation

## 2012-09-25 DIAGNOSIS — Z872 Personal history of diseases of the skin and subcutaneous tissue: Secondary | ICD-10-CM | POA: Insufficient documentation

## 2012-09-25 DIAGNOSIS — S239XXA Sprain of unspecified parts of thorax, initial encounter: Secondary | ICD-10-CM | POA: Insufficient documentation

## 2012-09-25 DIAGNOSIS — R059 Cough, unspecified: Secondary | ICD-10-CM | POA: Insufficient documentation

## 2012-09-25 DIAGNOSIS — D493 Neoplasm of unspecified behavior of breast: Secondary | ICD-10-CM | POA: Insufficient documentation

## 2012-09-25 DIAGNOSIS — S29012A Strain of muscle and tendon of back wall of thorax, initial encounter: Secondary | ICD-10-CM

## 2012-09-25 DIAGNOSIS — Z3202 Encounter for pregnancy test, result negative: Secondary | ICD-10-CM | POA: Insufficient documentation

## 2012-09-25 DIAGNOSIS — Y939 Activity, unspecified: Secondary | ICD-10-CM | POA: Insufficient documentation

## 2012-09-25 DIAGNOSIS — X58XXXA Exposure to other specified factors, initial encounter: Secondary | ICD-10-CM | POA: Insufficient documentation

## 2012-09-25 DIAGNOSIS — F17200 Nicotine dependence, unspecified, uncomplicated: Secondary | ICD-10-CM | POA: Insufficient documentation

## 2012-09-25 DIAGNOSIS — N390 Urinary tract infection, site not specified: Secondary | ICD-10-CM | POA: Insufficient documentation

## 2012-09-25 DIAGNOSIS — R0602 Shortness of breath: Secondary | ICD-10-CM | POA: Insufficient documentation

## 2012-09-25 LAB — URINALYSIS, ROUTINE W REFLEX MICROSCOPIC
Bilirubin Urine: NEGATIVE
Glucose, UA: NEGATIVE mg/dL
Hgb urine dipstick: NEGATIVE
Ketones, ur: NEGATIVE mg/dL
Nitrite: NEGATIVE
Protein, ur: NEGATIVE mg/dL
Specific Gravity, Urine: 1.01 (ref 1.005–1.030)
Urobilinogen, UA: 0.2 mg/dL (ref 0.0–1.0)
pH: 5.5 (ref 5.0–8.0)

## 2012-09-25 LAB — COMPREHENSIVE METABOLIC PANEL WITH GFR
ALT: 15 U/L (ref 0–35)
AST: 19 U/L (ref 0–37)
Albumin: 3.7 g/dL (ref 3.5–5.2)
Alkaline Phosphatase: 91 U/L (ref 39–117)
BUN: 12 mg/dL (ref 6–23)
CO2: 22 meq/L (ref 19–32)
Calcium: 9.3 mg/dL (ref 8.4–10.5)
Chloride: 101 meq/L (ref 96–112)
Creatinine, Ser: 0.94 mg/dL (ref 0.50–1.10)
GFR calc Af Amer: >90 mL/min
GFR calc non Af Amer: 79 mL/min — ABNORMAL LOW
Glucose, Bld: 95 mg/dL (ref 70–99)
Potassium: 4.5 meq/L (ref 3.5–5.1)
Sodium: 136 meq/L (ref 135–145)
Total Bilirubin: 0.2 mg/dL — ABNORMAL LOW (ref 0.3–1.2)
Total Protein: 7.8 g/dL (ref 6.0–8.3)

## 2012-09-25 LAB — CBC WITH DIFFERENTIAL/PLATELET
Basophils Absolute: 0.1 10*3/uL (ref 0.0–0.1)
Basophils Relative: 1 % (ref 0–1)
HCT: 42 % (ref 36.0–46.0)
Lymphocytes Relative: 16 % (ref 12–46)
Monocytes Absolute: 1.1 10*3/uL — ABNORMAL HIGH (ref 0.1–1.0)
Neutro Abs: 7.2 10*3/uL (ref 1.7–7.7)
Neutrophils Relative %: 70 % (ref 43–77)
Platelets: 264 10*3/uL (ref 150–400)
RDW: 13.8 % (ref 11.5–15.5)
WBC: 10.3 10*3/uL (ref 4.0–10.5)

## 2012-09-25 LAB — D-DIMER, QUANTITATIVE: D-Dimer, Quant: 0.57 ug/mL-FEU — ABNORMAL HIGH (ref 0.00–0.48)

## 2012-09-25 LAB — URINE MICROSCOPIC-ADD ON

## 2012-09-25 LAB — PREGNANCY, URINE: Preg Test, Ur: NEGATIVE

## 2012-09-25 MED ORDER — METHOCARBAMOL 500 MG PO TABS: 500.0000 mg | ORAL_TABLET | Freq: Two times a day (BID) | ORAL | Status: DC

## 2012-09-25 MED ORDER — NITROFURANTOIN MONOHYD MACRO 100 MG PO CAPS: 100.0000 mg | ORAL_CAPSULE | Freq: Two times a day (BID) | ORAL | Status: DC

## 2012-09-25 MED ORDER — KETOROLAC TROMETHAMINE 30 MG/ML IJ SOLN: 30.0000 mg | Freq: Once | INTRAMUSCULAR | Status: DC

## 2012-09-25 MED ORDER — IBUPROFEN 600 MG PO TABS: 600.0000 mg | ORAL_TABLET | Freq: Four times a day (QID) | ORAL | Status: DC | PRN

## 2012-09-25 MED ORDER — KETOROLAC TROMETHAMINE 60 MG/2ML IM SOLN
60.0000 mg | Freq: Once | INTRAMUSCULAR | Status: AC
  Administered 2012-09-25: 60 mg via INTRAMUSCULAR
  Filled 2012-09-25: qty 2

## 2012-09-25 MED ORDER — IOHEXOL 350 MG/ML SOLN
70.0000 mL | Freq: Once | INTRAVENOUS | Status: AC | PRN
  Administered 2012-09-25: 70 mL via INTRAVENOUS

## 2012-09-25 NOTE — ED Notes (Signed)
Pt reports right shoulder and neck pain which began Saturday. Pt reports she thought she had a muscle strain. Gradually became SOB feeling since then. Pt anxious on arrival to room but settles down with reassurance and slow breathing techniques.

## 2012-09-25 NOTE — ED Provider Notes (Signed)
History     CSN: [id number]  Arrival date & time 09/25/12  [id number]   First MD Initiated Contact with Patient 09/25/12 [phone]      Chief Complaint  Patient presents with  . Shortness of Breath    (Consider location/radiation/quality/duration/timing/severity/associated sxs/prior treatment) HPI Pt has had 3 days of R thoracic back pain worse with movement, palpation and deep breathing. States pain was gradual onset. +cough productive of yellow sputum. No recent travel or surgeries. No Hx of DVT/PE. Not on OCP's. No lower ext swelling or pain. No chest pain, fever or chills. +sick contacts with URI symptoms.  History reviewed. No pertinent past medical history.  Past Surgical History  Procedure Date  . Breast lumpectomy     bil breast: approx 2006    No family history on file.  History  Substance Use Topics  . Smoking status: Current Every Day Smoker -- 0.5 packs/day  . Smokeless tobacco: Not on file  . Alcohol Use: Yes     Comment: occasionally    OB History    Grav Para Term Preterm Abortions TAB SAB Ect Mult Living                  Review of Systems  Constitutional: Negative for fever and chills.  HENT: Negative for congestion, trouble swallowing, neck pain and neck stiffness.   Respiratory: Positive for cough and shortness of breath. Negative for wheezing.   Cardiovascular: Negative for chest pain, palpitations and leg swelling.  Gastrointestinal: Negative for nausea, vomiting and abdominal pain.  Genitourinary: Positive for vaginal bleeding. Negative for dysuria.  Musculoskeletal: Positive for myalgias and back pain.  Skin: Negative for rash and wound.  Neurological: Negative for dizziness, weakness, light-headedness, numbness and headaches.    Allergies  Shellfish allergy  Home Medications   Current Outpatient Rx  Name  Route  Sig  Dispense  Refill  . BC HEADACHE POWDER PO   Oral   Take 1 packet by mouth every 2 (two) hours as needed. For pain         .  B-12 PO   Oral   Take 1 tablet by mouth daily.         . ADULT MULTIVITAMIN W/MINERALS CH   Oral   Take 1 tablet by mouth daily.         . IBUPROFEN 600 MG PO TABS   Oral   Take 1 tablet (600 mg total) by mouth every 6 (six) hours as needed for pain.   30 tablet   0   . METHOCARBAMOL 500 MG PO TABS   Oral   Take 1 tablet (500 mg total) by mouth 2 (two) times daily.   20 tablet   0   . NITROFURANTOIN MONOHYD MACRO 100 MG PO CAPS   Oral   Take 1 capsule (100 mg total) by mouth 2 (two) times daily.   10 capsule   0     BP 102/71  Pulse 67  Temp 98.1 F (36.7 C) (Oral)  Resp 20  SpO2 100%  LMP 09/11/2012  Physical Exam  Nursing note and vitals reviewed. Constitutional: She is oriented to person, place, and time. She appears well-developed and well-nourished. No distress.  HENT:  Head: Normocephalic and atraumatic.  Mouth/Throat: Oropharynx is clear and moist.  Eyes: EOM are normal. Pupils are equal, round, and reactive to light.  Neck: Normal range of motion. Neck supple.       No meningismus   Cardiovascular: Normal  rate and regular rhythm.  Exam reveals no gallop and no friction rub.   No murmur heard. Pulmonary/Chest: Effort normal and breath sounds normal. No respiratory distress. She has no wheezes. She has no rales. She exhibits no tenderness.  Abdominal: Soft. Bowel sounds are normal. She exhibits no distension and no mass. There is no tenderness. There is no rebound and no guarding.  Musculoskeletal: Normal range of motion. She exhibits tenderness (TTP over right rhomboid muscles). She exhibits no edema.       No calf swelling or edema  Neurological: She is alert and oriented to person, place, and time.       Moves all ext without deficit  Skin: Skin is warm and dry. No rash noted. No erythema.  Psychiatric: Her behavior is normal.       anxious    ED Course  Procedures (including critical care time)  Labs Reviewed  CBC WITH DIFFERENTIAL -  Abnormal; Notable for the following:    Monocytes Absolute 1.1 (*)     All other components within normal limits  COMPREHENSIVE METABOLIC PANEL - Abnormal; Notable for the following:    Total Bilirubin 0.2 (*)     GFR calc non Af Amer 79 (*)     All other components within normal limits  URINALYSIS, ROUTINE W REFLEX MICROSCOPIC - Abnormal; Notable for the following:    APPearance CLOUDY (*)     Leukocytes, UA LARGE (*)     All other components within normal limits  URINE MICROSCOPIC-ADD ON - Abnormal; Notable for the following:    Squamous Epithelial / LPF MANY (*)     Bacteria, UA FEW (*)     All other components within normal limits  D-DIMER, QUANTITATIVE - Abnormal; Notable for the following:    D-Dimer, Quant 0.57 (*)     All other components within normal limits  PREGNANCY, URINE  URINE CULTURE   Dg Chest 2 View  09/25/2012  *RADIOLOGY REPORT*  Clinical Data: Right upper back pain extending through the chest. Shortness of breath.  CHEST - 2 VIEW  Comparison: Two-view chest 10/07/2006.  Findings: The heart size is normal.  The lungs are clear.  The visualized soft tissues and bony thorax are unremarkable.  IMPRESSION: Negative chest.   Original Report Authenticated By: Marin Roberts, M.D.    Ct Angio Chest W/cm &/or Wo Cm  09/25/2012  *RADIOLOGY REPORT*  Clinical Data: Shortness of breath with right shoulder and neck pain for 2 days.  Question pulmonary embolism.  CT ANGIOGRAPHY CHEST  Technique:  Multidetector CT imaging of the chest using the standard protocol during bolus administration of intravenous contrast. Multiplanar reconstructed images including MIPs were obtained and reviewed to evaluate the vascular anatomy.  Contrast: 70mL OMNIPAQUE IOHEXOL 350 MG/ML SOLN  Comparison: Chest radiographs today and 10/07/2006.  Findings: The pulmonary arteries are well opacified with contrast. There is no evidence of acute pulmonary embolism.  The thoracic aorta and great vessels  appear normal.  There are no enlarged mediastinal or hilar lymph nodes.  There is no pleural or pericardial effusion.  The lungs are clear.  There is a well-circumscribed soft tissue nodule superiorly in the left breast, measuring up to 2.9 cm in diameter.  This is seen on axial images 54 - 65 of series 4 and is nonspecific.  There are no enlarged axillary lymph nodes.  The visualized upper abdomen appears unremarkable.  IMPRESSION:  1.  No evidence of acute pulmonary embolism other acute chest process.  2.  Nonspecific soft tissue nodule in the left breast. Statistically, this is likely a benign finding such as a fibroadenoma.  However, correlation with physical examination is recommended.  Ultrasound correlation should also be considered.   Original Report Authenticated By: Carey Bullocks, M.D.      1. Muscle strain of right upper back   2. UTI (urinary tract infection)       MDM          Loren Racer, MD 09/26/12 [phone]

## 2012-09-26 LAB — URINE CULTURE: Colony Count: 40000

## 2014-11-23 IMAGING — CT CT ANGIO CHEST
2 of 7 series · 18 of 36 positions shown · IV contrast (omnipaque)
Comparison: Chest radiographs today and 10/07/2006.

CLINICAL DATA: Shortness of breath with right shoulder and neck
pain for 2 days.  Question pulmonary embolism.

CT ANGIOGRAPHY CHEST
TECHNIQUE: Multidetector CT imaging of the chest using the
standard protocol during bolus administration of intravenous
contrast. Multiplanar reconstructed images including MIPs were
obtained and reviewed to evaluate the vascular anatomy.
Contrast: 70mL OMNIPAQUE IOHEXOL 350 MG/ML SOLN

[Series 5: pe thins · axial · 0.68mm/px · z∈[-217,+8]mm · 17 of 255 slices shown]
[im 15/255  lung]
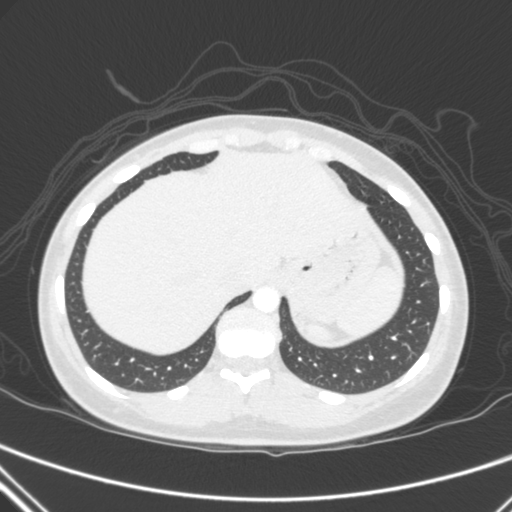
[im 29/255  mediastinal]
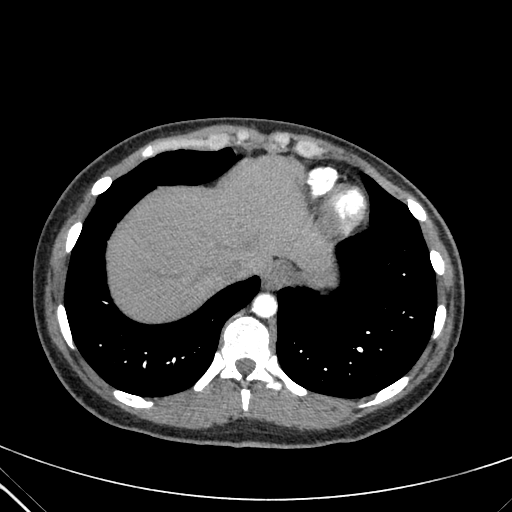
[im 43/255  lung]
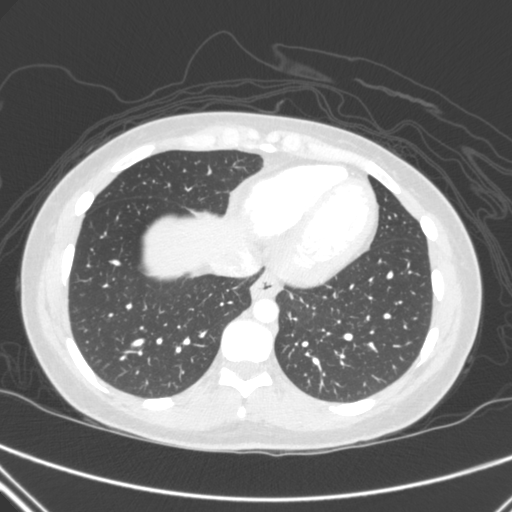
[im 57/255  mediastinal]
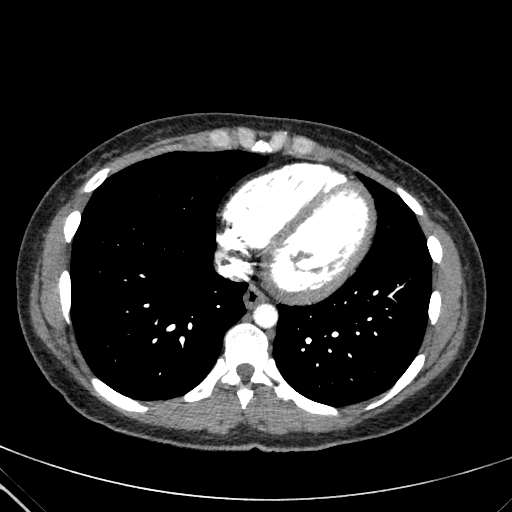
[im 71/255  lung]
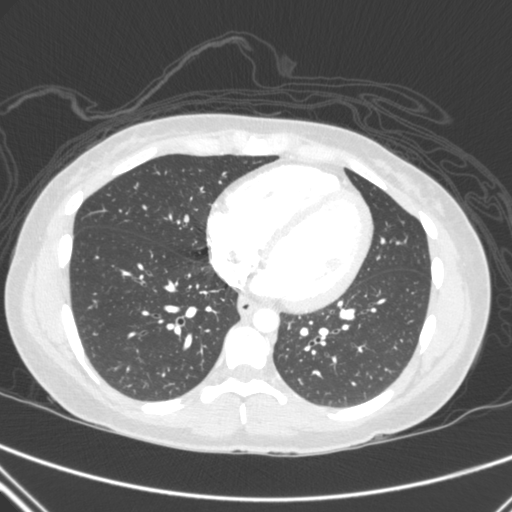
[im 85/255  mediastinal]
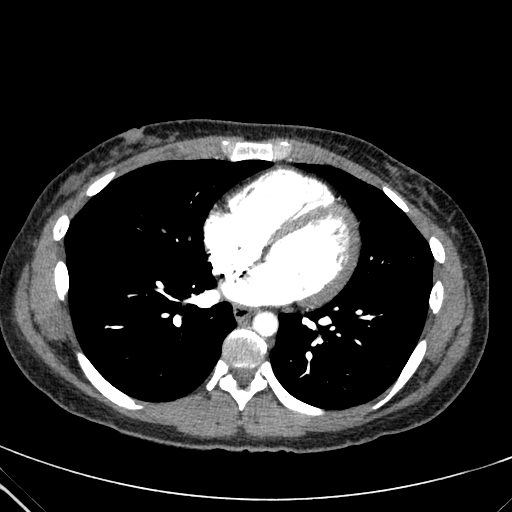
[im 99/255  lung]
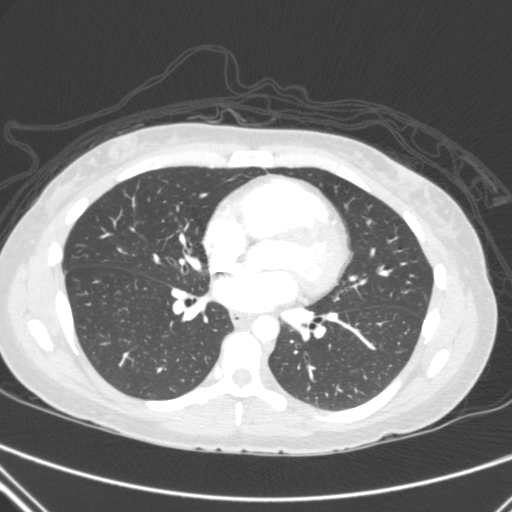
[im 113/255  mediastinal]
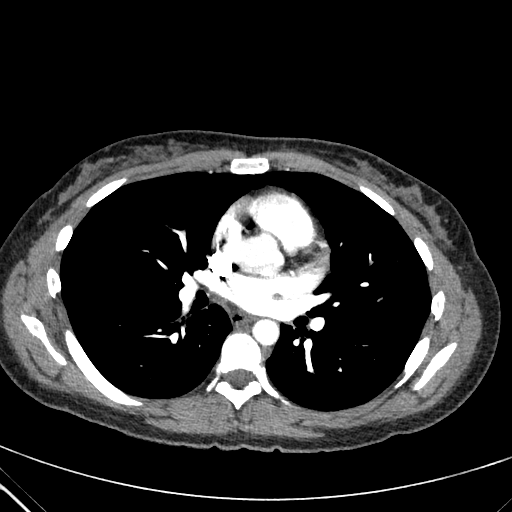
[im 128/255  lung]
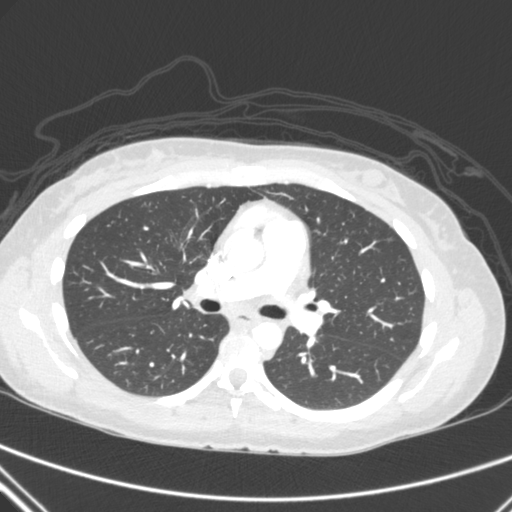
[im 142/255  mediastinal]
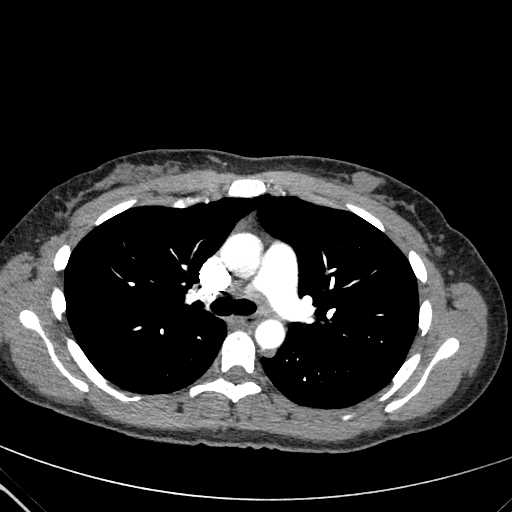
[im 156/255  lung]
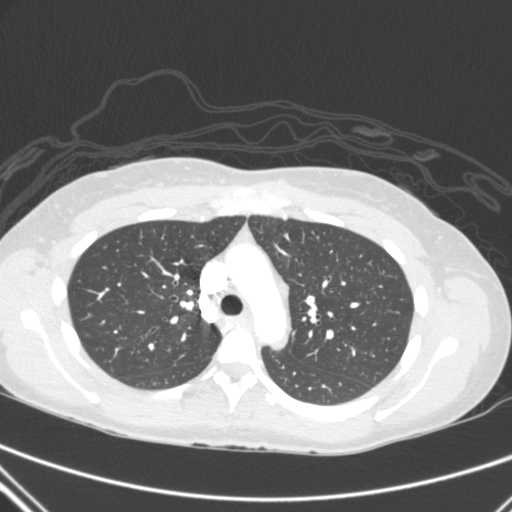
[im 170/255  mediastinal]
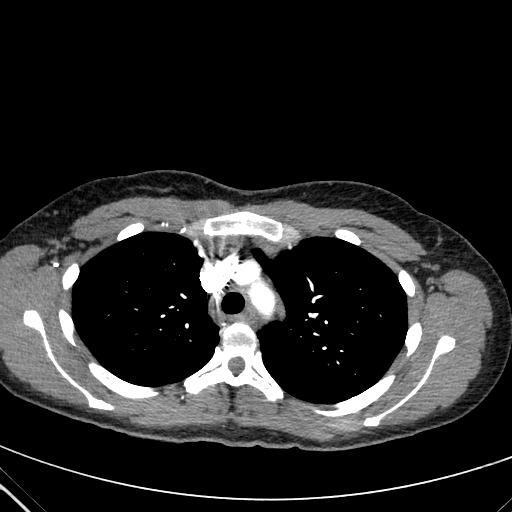
[im 184/255  lung]
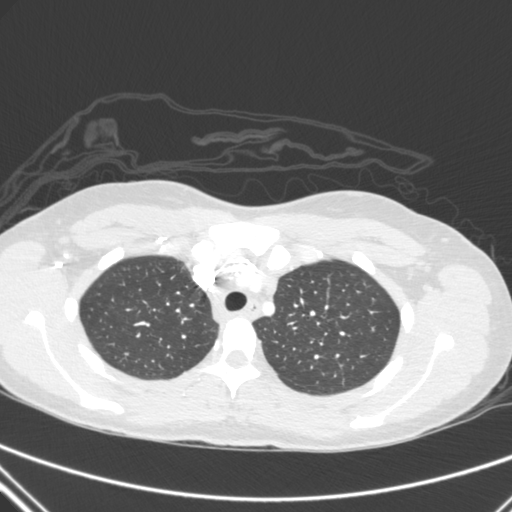
[im 198/255  mediastinal]
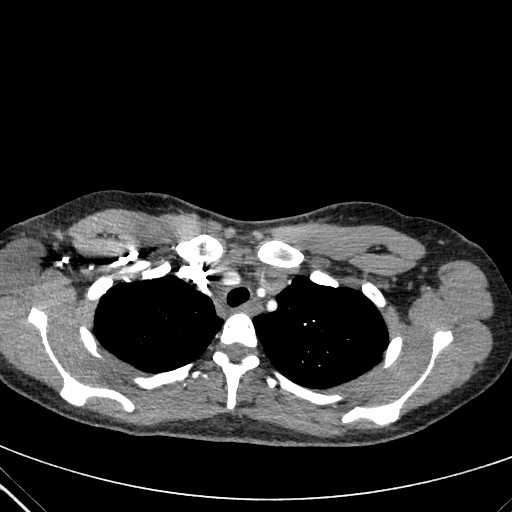
[im 212/255  lung]
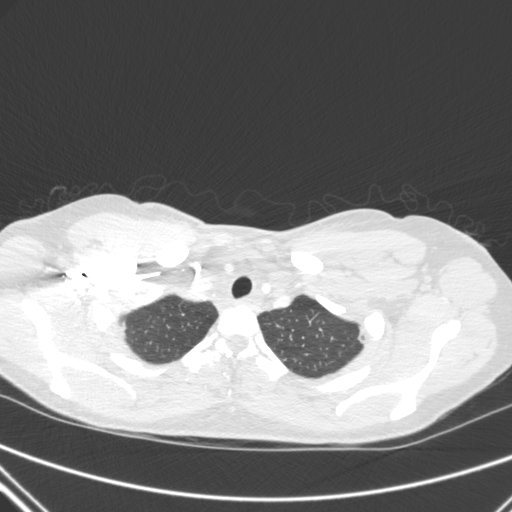
[im 226/255  mediastinal]
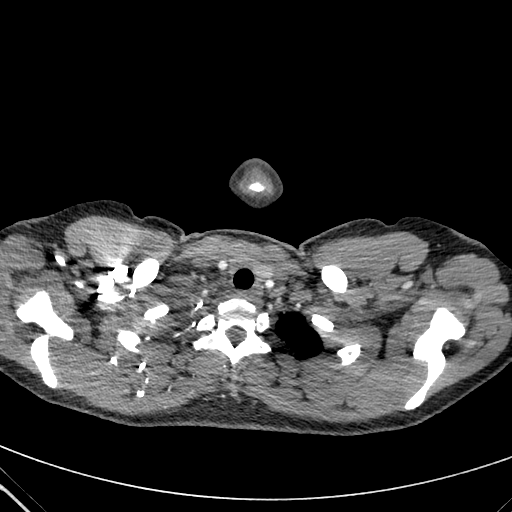
[im 240/255  lung]
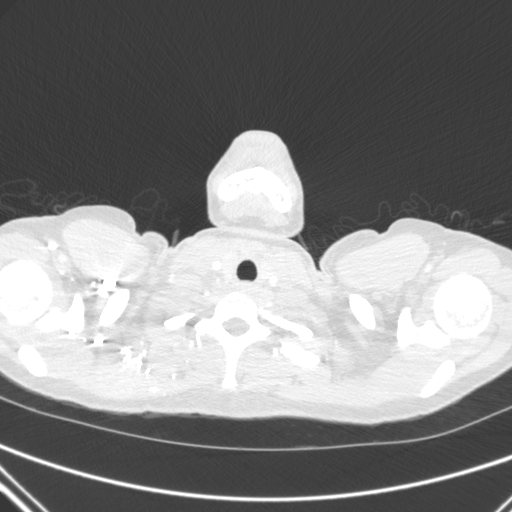

[mpr coronals · coronal · 0.68mm/px · 1 of 98 slices shown]
[im 49/98  mediastinal]
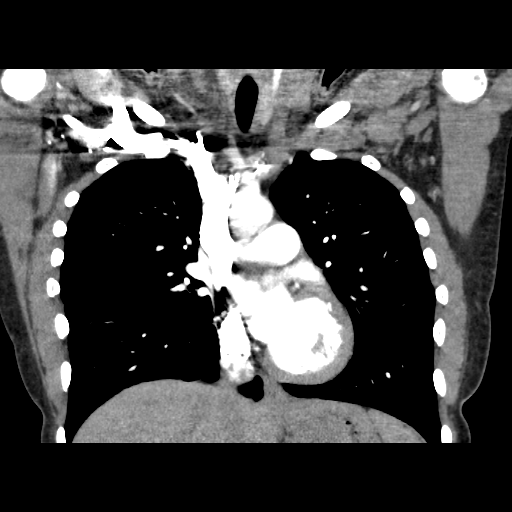

[18 of 36 positions shown; findings below may reference images not displayed]

FINDINGS: The pulmonary arteries are well opacified with contrast.
There is no evidence of acute pulmonary embolism.  The thoracic
aorta and great vessels appear normal.  There are no enlarged
mediastinal or hilar lymph nodes.

There is no pleural or pericardial effusion.  The lungs are clear.

There is a well-circumscribed soft tissue nodule superiorly in the
left breast, measuring up to 2.9 cm in diameter.  This is seen on
axial images 54 - 65 of series 4 and is nonspecific.  There are no
enlarged axillary lymph nodes.

The visualized upper abdomen appears unremarkable.
IMPRESSION: 1.  No evidence of acute pulmonary embolism other acute chest
process.
2.  Nonspecific soft tissue nodule in the left breast.
Statistically, this is likely a benign finding such as a
fibroadenoma.  However, correlation with physical examination is
recommended.  Ultrasound correlation should also be considered.

## 2019-09-04 ENCOUNTER — Encounter (HOSPITAL_COMMUNITY): Payer: Self-pay | Admitting: Emergency Medicine

## 2019-09-04 ENCOUNTER — Emergency Department (HOSPITAL_COMMUNITY)
Admission: EM | Admit: 2019-09-04 | Discharge: 2019-09-04 | Disposition: A | Discharge status: Home / Self Care | Payer: Medicare Other | Attending: Emergency Medicine | Admitting: Emergency Medicine

## 2019-09-04 DIAGNOSIS — Z853 Personal history of malignant neoplasm of breast: Secondary | ICD-10-CM | POA: Diagnosis not present

## 2019-09-04 DIAGNOSIS — M5481 Occipital neuralgia: Secondary | ICD-10-CM | POA: Diagnosis not present

## 2019-09-04 DIAGNOSIS — G44209 Tension-type headache, unspecified, not intractable: Secondary | ICD-10-CM | POA: Insufficient documentation

## 2019-09-04 DIAGNOSIS — F17200 Nicotine dependence, unspecified, uncomplicated: Secondary | ICD-10-CM | POA: Insufficient documentation

## 2019-09-04 DIAGNOSIS — R519 Headache, unspecified: Secondary | ICD-10-CM | POA: Diagnosis present

## 2019-09-04 LAB — POC URINE PREG, ED: Preg Test, Ur: NEGATIVE

## 2019-09-04 MED ORDER — LIDOCAINE HCL (PF) 1 % IJ SOLN
5.0000 mL | Freq: Once | INTRAMUSCULAR | Status: AC
  Administered 2019-09-04: 14:00:00 5 mL via INTRADERMAL
  Filled 2019-09-04: qty 5

## 2019-09-04 MED ORDER — DIAZEPAM 5 MG PO TABS
10.0000 mg | ORAL_TABLET | Freq: Once | ORAL | Status: AC
  Administered 2019-09-04: 10 mg via ORAL
  Filled 2019-09-04: qty 2

## 2019-09-04 MED ORDER — DEXAMETHASONE SODIUM PHOSPHATE 10 MG/ML IJ SOLN
10.0000 mg | Freq: Once | INTRAMUSCULAR | Status: AC
  Administered 2019-09-04: 12:00:00 10 mg via INTRAVENOUS
  Filled 2019-09-04: qty 1

## 2019-09-04 MED ORDER — BUPIVACAINE HCL (PF) 0.5 % IJ SOLN
10.0000 mL | Freq: Once | INTRAMUSCULAR | Status: AC
  Administered 2019-09-04: 10 mL
  Filled 2019-09-04: qty 10

## 2019-09-04 MED ORDER — KETOROLAC TROMETHAMINE 30 MG/ML IJ SOLN
30.0000 mg | Freq: Once | INTRAMUSCULAR | Status: AC
  Administered 2019-09-04: 30 mg via INTRAVENOUS
  Filled 2019-09-04: qty 1

## 2019-09-04 NOTE — ED Triage Notes (Signed)
Pt here from out of town with c/o H/a times 3 weeks , no blurred vision , pt does have a hx of breast CA 3 yrs remission

## 2019-09-04 NOTE — ED Provider Notes (Signed)
[facility] Provider Note   CSN: [id number] Arrival date & time: 09/04/19  [id number]     History   Chief Complaint No chief complaint on file.   HPI Raven Liu is a 38 y.o. female.     38 year old female with distant history of breast cancer who presents with headache.  For the past few weeks, she has had problems with a nagging, mild to moderate headache that she states sometimes involves pain along her right neck to her shoulder but often is on her right occipital head and radiates forward.  She has taken Tylenol and ibuprofen with temporary relief but the headache always returns.  It has never been severe but it just will not completely resolve.  She denies any associated vision changes, vomiting, extremity weakness, numbness, fevers, cough/cold symptoms, or recent illness.  She takes Tylenol daily but not ibuprofen daily.  Last dose of medication was last night.  She denies any history of migraines.  The history is provided by the patient.    No past medical history on file.  There are no active problems to display for this patient.   Past Surgical History:  Procedure Laterality Date  . BREAST LUMPECTOMY     bil breast: approx 2006     OB History   No obstetric history on file.      Home Medications    Prior to Admission medications   Medication Sig Start Date End Date Taking? Authorizing Provider  acetaminophen (TYLENOL) 500 MG tablet Take 1,000 mg by mouth every 6 (six) hours as needed for mild pain or headache.   Yes [provider]  ibuprofen (ADVIL) 200 MG tablet Take 400 mg by mouth every 6 (six) hours as needed for moderate pain.   Yes [provider]    Family History No family history on file.  Social History Social History   Tobacco Use  . Smoking status: Current Every Day Smoker    Packs/day: 0.50  . Smokeless tobacco: Never Used  Substance Use Topics  . Alcohol use: Yes    Comment:  occasionally  . Drug use: No     Allergies   Shellfish allergy   Review of Systems Review of Systems All other systems reviewed and are negative except that which was mentioned in HPI   Physical Exam Updated Vital Signs BP 119/73   Pulse 72   Temp 98.3 F (36.8 C) (Oral)   Resp 16   SpO2 100%   Physical Exam Vitals signs and nursing note reviewed.  Constitutional:      General: She is not in acute distress.    Appearance: She is well-developed.     Comments: Awake, alert Pleasant, well-appearing  HENT:     Head: Normocephalic and atraumatic.     Mouth/Throat:     Mouth: Mucous membranes are moist.     Pharynx: Oropharynx is clear.  Eyes:     Extraocular Movements: Extraocular movements intact.     Conjunctiva/sclera: Conjunctivae normal.     Pupils: Pupils are equal, round, and reactive to light.  Neck:     Musculoskeletal: Neck supple.  Cardiovascular:     Rate and Rhythm: Normal rate.  Pulmonary:     Effort: Pulmonary effort is normal.  Musculoskeletal:     Comments: Mild tenderness at R occipital scalp and tenderness along R cervical paraspinal muscles  Skin:    General: Skin is warm and dry.  Neurological:  Mental Status: She is alert and oriented to person, place, and time.     Cranial Nerves: No cranial nerve deficit.     Motor: No abnormal muscle tone.     Deep Tendon Reflexes: Reflexes are normal and symmetric.     Comments: Fluent speech, normal finger-to-nose testing, negative pronator drift, no clonus 5/5 strength and normal sensation x all 4 extremities  Psychiatric:        Mood and Affect: Mood normal.        Thought Content: Thought content normal.        Judgment: Judgment normal.      ED Treatments / Results  Labs (all labs ordered are listed, but only abnormal results are displayed) Labs Reviewed  POC URINE PREG, ED    EKG None  Radiology No results found.  Procedures .Nerve Block  Date/Time: 09/04/2019 12:40 PM  Performed by: Sharlett Iles, MD Authorized by: Sharlett Iles, MD   Consent:    Consent obtained:  Verbal   Consent given by:  Patient   Risks discussed:  Allergic reaction, pain and unsuccessful block   Alternatives discussed:  Alternative treatment Indications:    Indications:  Pain relief Location:    Body area:  Head   Head nerve:  Lesser occipital   Laterality:  Right Pre-procedure details:    Skin preparation:  2% chlorhexidine   Preparation: Patient was prepped and draped in usual sterile fashion   Procedure details (see MAR for exact dosages):    Block needle gauge:  27 G   Anesthetic injected:  Bupivacaine 0.5% w/o epi and lidocaine 1% w/o epi   Steroid injected:  None   Additive injected:  None   Injection procedure:  Incremental injection, introduced needle, negative aspiration for blood and anatomic landmarks palpated Post-procedure details:    Dressing:  None   Patient tolerance of procedure:  Tolerated well, no immediate complications   (including critical care time)  Medications Ordered in ED Medications  diazepam (VALIUM) tablet 10 mg (10 mg Oral Given 09/04/19 1131)  bupivacaine (MARCAINE) 0.5 % injection 10 mL (10 mLs Infiltration Given 09/04/19 1330)  lidocaine (PF) (XYLOCAINE) 1 % injection 5 mL (5 mLs Intradermal Given 09/04/19 1330)  dexamethasone (DECADRON) injection 10 mg (10 mg Intravenous Given 09/04/19 1131)  ketorolac (TORADOL) 30 MG/ML injection 30 mg (30 mg Intravenous Given 09/04/19 1202)     Initial Impression / Assessment and Plan / ED Course  I have reviewed the triage vital signs and the nursing notes.  Pertinent labs that were available during my care of the patient were reviewed by me and considered in my medical decision making (see chart for details).       Well appearing, neuro intact.  No sudden onset of severe symptoms or neurologic deficits to suggest SAH, meningitis, or other life-threatening intracranial  process.  Based on distribution of pain, suspect possible occipital neuralgia or tension headaches.  I discussed treatment options including oral/IV medications and/or occipital nerve block.  Patient requested both.  On reassessment, pt felt much better. Headache almost resolved.  I discussed supportive measures at home, PCP follow-up, and return precautions.  She voiced understanding. Final Clinical Impressions(s) / ED Diagnoses   Final diagnoses:  Tension headache  Occipital neuralgia of right side    ED Discharge Orders    None       Little, Wenda Overland, MD 09/04/19 [phone]

## 2019-09-04 NOTE — ED Notes (Signed)
Pt given cheese and crackers
